# Patient Record
Sex: Female | Born: 1980 | Race: White | Hispanic: No | Marital: Married
Health system: Midwestern US, Academic
[De-identification: ages and names within clinical notes are randomized; demographics above are authoritative.]

---

## 2017-07-07 LAB — COMPREHENSIVE METABOLIC PANEL
Lab: 0.6
Lab: 0.8
Lab: 103
Lab: 104
Lab: 12 — ABNORMAL HIGH (ref 27.0–31.0)
Lab: 139
Lab: 14
Lab: 14
Lab: 15 — ABNORMAL HIGH (ref 0–14)
Lab: 4.6
Lab: 8
Lab: 82
Lab: 9.5

## 2017-07-07 LAB — CBC: Lab: 10

## 2017-07-07 LAB — THYROID STIMULATING HORMONE-TSH: Lab: 1.2 — ABNORMAL LOW (ref 3.5–5.1)

## 2017-07-07 LAB — MYOGLOBIN-CHEM: Lab: 17 — ABNORMAL LOW (ref 22–29)

## 2017-07-07 LAB — TROPONIN-I

## 2017-07-08 ENCOUNTER — Encounter: Admit: 2017-07-08 | Discharge: 2017-07-08 | Payer: BC Managed Care – PPO

## 2017-07-09 ENCOUNTER — Encounter: Admit: 2017-07-09 | Discharge: 2017-07-09 | Payer: BC Managed Care – PPO

## 2017-07-10 ENCOUNTER — Encounter: Admit: 2017-07-10 | Discharge: 2017-07-10 | Payer: BC Managed Care – PPO

## 2017-07-10 DIAGNOSIS — Z720 Tobacco use: Principal | ICD-10-CM

## 2017-07-10 DIAGNOSIS — R002 Palpitations: ICD-10-CM

## 2017-07-23 ENCOUNTER — Encounter: Admit: 2017-07-23 | Discharge: 2017-07-23 | Payer: BC Managed Care – PPO

## 2017-07-23 ENCOUNTER — Ambulatory Visit: Admit: 2017-07-23 | Discharge: 2017-07-24 | Payer: BC Managed Care – HMO

## 2017-07-23 DIAGNOSIS — R002 Palpitations: ICD-10-CM

## 2017-07-23 DIAGNOSIS — Z720 Tobacco use: Principal | ICD-10-CM

## 2017-07-23 DIAGNOSIS — K219 Gastro-esophageal reflux disease without esophagitis: ICD-10-CM

## 2017-07-30 ENCOUNTER — Encounter: Admit: 2017-07-30 | Discharge: 2017-07-30 | Payer: BC Managed Care – PPO

## 2017-08-10 ENCOUNTER — Ambulatory Visit: Admit: 2017-08-10 | Discharge: 2017-08-11 | Payer: BC Managed Care – HMO

## 2017-08-10 DIAGNOSIS — Z720 Tobacco use: Principal | ICD-10-CM

## 2017-08-10 DIAGNOSIS — R002 Palpitations: ICD-10-CM

## 2017-08-11 ENCOUNTER — Encounter: Admit: 2017-08-11 | Discharge: 2017-08-11 | Payer: BC Managed Care – PPO

## 2017-08-17 ENCOUNTER — Ambulatory Visit: Admit: 2017-08-17 | Discharge: 2017-08-18 | Payer: BC Managed Care – HMO

## 2017-08-18 DIAGNOSIS — Z720 Tobacco use: Principal | ICD-10-CM

## 2017-08-18 DIAGNOSIS — R002 Palpitations: ICD-10-CM

## 2017-08-27 ENCOUNTER — Encounter: Admit: 2017-08-27 | Discharge: 2017-08-27 | Payer: BC Managed Care – PPO

## 2017-08-27 ENCOUNTER — Ambulatory Visit: Admit: 2017-08-27 | Discharge: 2017-08-28 | Payer: BC Managed Care – HMO

## 2017-08-27 DIAGNOSIS — K219 Gastro-esophageal reflux disease without esophagitis: ICD-10-CM

## 2017-08-27 DIAGNOSIS — F419 Anxiety disorder, unspecified: ICD-10-CM

## 2017-08-27 DIAGNOSIS — R002 Palpitations: ICD-10-CM

## 2017-08-27 DIAGNOSIS — G4739 Other sleep apnea: Principal | ICD-10-CM

## 2017-08-27 DIAGNOSIS — Z720 Tobacco use: Principal | ICD-10-CM

## 2017-08-31 ENCOUNTER — Encounter: Admit: 2017-08-31 | Discharge: 2017-08-31 | Payer: BC Managed Care – PPO

## 2017-09-19 ENCOUNTER — Encounter: Admit: 2017-09-19 | Discharge: 2017-09-19 | Payer: BC Managed Care – PPO

## 2017-09-19 DIAGNOSIS — G4739 Other sleep apnea: ICD-10-CM

## 2017-09-19 DIAGNOSIS — Z720 Tobacco use: ICD-10-CM

## 2017-09-19 DIAGNOSIS — R002 Palpitations: ICD-10-CM

## 2017-09-19 DIAGNOSIS — F419 Anxiety disorder, unspecified: ICD-10-CM

## 2017-09-19 DIAGNOSIS — K219 Gastro-esophageal reflux disease without esophagitis: Principal | ICD-10-CM

## 2017-11-18 ENCOUNTER — Encounter: Admit: 2017-11-18 | Discharge: 2017-11-18 | Payer: BC Managed Care – PPO

## 2017-11-26 ENCOUNTER — Encounter: Admit: 2017-11-26 | Discharge: 2017-11-26 | Payer: BC Managed Care – PPO

## 2017-11-26 ENCOUNTER — Ambulatory Visit: Admit: 2017-11-26 | Discharge: 2017-11-27 | Payer: BC Managed Care – HMO

## 2017-11-26 DIAGNOSIS — G4739 Other sleep apnea: ICD-10-CM

## 2017-11-26 DIAGNOSIS — Z720 Tobacco use: Principal | ICD-10-CM

## 2017-11-26 DIAGNOSIS — K219 Gastro-esophageal reflux disease without esophagitis: ICD-10-CM

## 2017-11-26 DIAGNOSIS — R002 Palpitations: ICD-10-CM

## 2018-11-30 ENCOUNTER — Encounter: Admit: 2018-11-30 | Discharge: 2018-11-30

## 2018-11-30 ENCOUNTER — Ambulatory Visit: Admit: 2018-11-30 | Discharge: 2018-12-01

## 2018-11-30 DIAGNOSIS — Z720 Tobacco use: Secondary | ICD-10-CM

## 2018-11-30 DIAGNOSIS — R002 Palpitations: Secondary | ICD-10-CM

## 2018-11-30 DIAGNOSIS — F419 Anxiety disorder, unspecified: Secondary | ICD-10-CM

## 2018-11-30 DIAGNOSIS — G4739 Other sleep apnea: Secondary | ICD-10-CM

## 2018-11-30 DIAGNOSIS — K219 Gastro-esophageal reflux disease without esophagitis: Secondary | ICD-10-CM

## 2018-11-30 DIAGNOSIS — E7800 Pure hypercholesterolemia, unspecified: Secondary | ICD-10-CM

## 2018-11-30 MED ORDER — ESCITALOPRAM OXALATE 10 MG PO TAB
10 mg | ORAL_TABLET | Freq: Every day | ORAL | 3 refills | Status: DC
Start: 2018-11-30 — End: 2019-01-21

## 2018-11-30 NOTE — Patient Instructions
Check to see if your Ziopatch will be covered by insurance:       Please call the [employer] (ziopatch) company at [phone]-- option #1 (3 times)   - ask for the Direct Billing Patient Benefits department.     They should tell you if you are covered and how much the co-pay will be if there is one.  We generally don't put ziopatches on the same day of your appointment, unless you have medicare,  as they usually will pay or have a low co-pay.  However, if you have medicare and are concerned at all of your co-pay or insurance payment, then you should go ahead and call [employer].      If the co-pay is too high, you can always negotiate a self-pay rate with [employer]- (not going through insurance) - which may be a more reasonable cost for you.  Please note that once they submit the bill to the insurance for the ziopatch, the bill is non-negotiable, so all negotiations needs to occur when you first contact them.     Please let us know if the cost is not reasonable for you and we will order another type of monitoring.  If you are ok with the co-pay, please call us and schedule to come back to clinic to have the ziopatch applied.

## 2018-11-30 NOTE — Progress Notes
Date of Service: 11/30/2018    Tanya Pearson is a 38 y.o. female.       HPI     Patient is a very pleasant 38 year old white female that has a history of heart palpitations.  She reports that in the past few weeks her symptoms have increased, they are more frequent, these episodes of symptomatic heart palpitations are not associated with dizziness, sometimes she can have them 2 days neuro or even daily and then they will not occurred again for few days.  Typically they last 15 minutes, they are not associated with presyncope or syncope, shortness of breath can occur infrequently.    Patient also has a history of anxiety.    She was evaluated with an echocardiogram in March 2019 and the study was overall unremarkable.  An event monitor also performed in March 2019 did not demonstrate any significant arrhythmias.       Vitals:    11/30/18 0921 11/30/18 0938   BP: 104/70 106/70   BP Source: Arm, Left Upper Arm, Right Upper   Pulse: 86    SpO2: 98%    Weight: 83.6 kg (184 lb 3.2 oz)    Height: 1.626 m (5' 4)    PainSc: Zero      Body mass index is 31.62 kg/m???.     Past Medical History  Patient Active Problem List    Diagnosis Date Noted   ??? Other sleep apnea 08/27/2017   ??? Anxiety 08/27/2017   ??? GERD (gastroesophageal reflux disease) 07/23/2017   ??? Tobacco abuse 07/10/2017   ??? Palpitations 07/10/2017         Review of Systems   Constitution: Negative.   HENT: Negative.    Eyes: Negative.    Cardiovascular: Positive for palpitations.   Respiratory: Negative.    Endocrine: Negative.    Hematologic/Lymphatic: Negative.    Skin: Negative.    Musculoskeletal: Negative.    Gastrointestinal: Negative.    Genitourinary: Negative.    Neurological: Negative.    Psychiatric/Behavioral: The patient is nervous/anxious.    Allergic/Immunologic: Positive for environmental allergies.       Physical Exam  General Appearance: normal in appearance  Skin: warm, moist, no ulcers or xanthomas Eyes: conjunctivae and lids normal, pupils are equal and round  Lips & Oral Mucosa: no pallor or cyanosis  Neck Veins: neck veins are flat, neck veins are not distended  Chest Inspection: chest is normal in appearance  Respiratory Effort: breathing comfortably, no respiratory distress  Auscultation/Percussion: lungs clear to auscultation, no rales or rhonchi, no wheezing  Cardiac Rhythm: regular rhythm and normal rate  Cardiac Auscultation: S1, S2 normal, no rub, no gallop  Murmurs: no murmur  Carotid Arteries: normal carotid upstroke bilaterally, no bruit  Lower Extremity Edema: no lower extremity edema  Abdominal Exam: soft, non-tender, no masses, bowel sounds normal  Liver & Spleen: no organomegaly  Language and Memory: patient responsive and seems to comprehend information  Neurologic Exam: neurological assessment grossly intact      Cardiovascular Studies  Twelve-lead EKG demonstrates normal sinus rhythm, no ST segment T wave changes    Problems Addressed Today  Encounter Diagnoses   Name Primary?   ??? Pure hypercholesterolemia Yes   ??? Palpitations    ??? Anxiety    ??? Tobacco abuse    ??? Other sleep apnea    ??? Gastroesophageal reflux disease, esophagitis presence not specified        Assessment and Plan     In  summary: This is a 38 year old white female with a history of symptomatic heart palpitations, her episodes have intensified lately, they occur more frequently and last longer, patient has not experienced presyncope or syncope.    She was evaluated with an echocardiogram in March 2019 and was not found to have any structural heart disease.    Plan:    1.  I did suggest another 14 days Holter monitor for evaluation of underlying arrhythmias, patient could have episodes of SVT or she could have PVCs although these were not identified in the past.  2.  For symptoms of anxiety that she openly admits I did initiate escitalopram 10 mg p.o. daily. 3.  Follow-up in approximately 10 to 12 weeks to discuss the results of the event monitor and patient symptoms.  He will then I asked her to continue all other current medications and to monitor her symptoms.         Current Medications (including today's revisions)  ??? loratadine (CLARITIN) 10 mg tablet Take 10 mg by mouth as Needed.   ??? MAGNESIUM PO Take 325 mg by mouth daily.   ??? omeprazole(+) (PRILOSEC) 10 mg capsule Take 10 mg by mouth as Needed.   ??? other medication 1 Dose. olly stress gummies as needed

## 2018-11-30 NOTE — Progress Notes
[employer] Enrollment complete

## 2018-11-30 NOTE — Progress Notes
Long Term Monitor Placement Record  Ordering Physician: Dr. Samantha Crimes  Diagnosis: Palpitations  Length: 14 days  Serial Number: [id number]  Location: [facility]

## 2018-12-27 ENCOUNTER — Encounter: Admit: 2018-12-27 | Discharge: 2018-12-27

## 2018-12-27 NOTE — Telephone Encounter
12/27/2018 1:40 PM Patient called back.  We discussed holter results and recommendations, per below.  Patient stated she discussed concern of starting beta blocker with Dr. Virgina Organ at her last office visit, due to low blood pressure.  Patient stated that she "feels great" after starting on Lexapro; patient started with 5 mg daily, 7/2-12/09/2018; now up to prescribed dose of 10 mg daily, starting on 12/10/2018.  Patient no longer feels palpitations; denies dizziness.    I told the patient we would discuss with Dr. Virgina Organ and f/u with her any any further recommendations.  She will continue on Lexapro and keep her OV in October, for now.    Routing this note to [facility]. [facility] RN pool for follow-up with Dr. Virgina Organ.

## 2019-01-04 NOTE — Telephone Encounter
Reviewed with [facility]. [facility] okay with holding off starting a beta blocker and will f/u in Oct.  No further needs at this time.

## 2019-01-21 ENCOUNTER — Encounter: Admit: 2019-01-21 | Discharge: 2019-01-21

## 2019-01-21 MED ORDER — ESCITALOPRAM OXALATE 10 MG PO TAB
ORAL_TABLET | Freq: Every day | 0 refills | Status: DC
Start: 2019-01-21 — End: 2019-03-17

## 2019-01-21 NOTE — Telephone Encounter
01/21/2019 2:44 PM Refilled Lexapro, per Dr. Virgina Organ.

## 2019-03-17 ENCOUNTER — Encounter: Admit: 2019-03-17 | Discharge: 2019-03-17 | Payer: BC Managed Care – PPO

## 2019-03-17 DIAGNOSIS — R002 Palpitations: Secondary | ICD-10-CM

## 2019-03-17 DIAGNOSIS — Z720 Tobacco use: Secondary | ICD-10-CM

## 2019-03-17 DIAGNOSIS — R9431 Abnormal electrocardiogram [ECG] [EKG]: Secondary | ICD-10-CM

## 2019-03-17 DIAGNOSIS — K219 Gastro-esophageal reflux disease without esophagitis: Secondary | ICD-10-CM

## 2019-03-17 DIAGNOSIS — G4739 Other sleep apnea: Secondary | ICD-10-CM

## 2019-03-17 MED ORDER — ESCITALOPRAM OXALATE 10 MG PO TAB
10 mg | ORAL_TABLET | Freq: Every day | ORAL | 5 refills | Status: DC
Start: 2019-03-17 — End: 2019-05-30

## 2019-03-17 MED ORDER — DILTIAZEM HCL 30 MG PO TAB
30 mg | ORAL_TABLET | Freq: Two times a day (BID) | ORAL | 3 refills | 45.00000 days | Status: AC
Start: 2019-03-17 — End: ?

## 2019-03-17 NOTE — Patient Instructions
Thank you for visiting our office today.    We would like to make the following medication adjustments:      Start Cardizem 30 mg daily.        Otherwise continue the same medications as you have been doing.          We will be pursuing the following tests after your appointment today:       Orders Placed This Encounter    dilTIAZem HCL (CARDIZEM) 30 mg tablet    escitalopram oxalate (LEXAPRO) 10 mg tablet         We will send you a reminder for your next appointment. Please call us in the meantime with any questions or concerns.        Please allow 5-7 business days for our providers to review your results. All normal results will go to MyChart. If you do not have Mychart, it is strongly recommended to get this so you can easily view all your results. If you do not have mychart, we will attempt to call you once with normal lab and testing results. If we cannot reach you by phone with normal results, we will send you a letter.  If you have not heard the results of your testing after one week please give [place] a call.       Your Cardiovascular Medicine [place] Team Richardson Landry, Rene Kocher and [place])  phone number is [phone].

## 2019-03-17 NOTE — Progress Notes
Date of Service: 03/17/2019    Tanya Pearson is a 38 y.o. female.       HPI     Patient is a 38 year old white female, she was evaluated in our office in July for symptomatic heart palpitations.  At that time patient also exhibited symptoms of anxiety and I did initiate Lexapro 10 mg p.o. daily and she thinks that that helped a great deal.    In addition patient was evaluated with 14 days Holter monitor, this demonstrated predominantly normal sinus rhythm, one episode of SVT occurred and it lasted 9.9 seconds, the overall supraventricular tachycardia burden was 1.1%, patient also had isolated PVCs which represented less than 1% of the total number of beats.    She continues to have symptomatic heart palpitations, she notices this more when she tries to relax or she sits down.  Patient has decreased the consumption of caffeinated products and also currently smokes less than 1/2 pack cigarettes a day.         Vitals:    03/17/19 0909   BP: 110/76   BP Source: Arm, Left Upper   Pulse: 72   Temp: 37.9 ?C (100.2 ?F)   SpO2: 98%   Weight: 83.9 kg (185 lb)   Height: 1.626 m (5' 4)   PainSc: Zero     Body mass index is 31.76 kg/m?Marland Kitchen     Past Medical History  Patient Active Problem List    Diagnosis Date Noted   ? Other sleep apnea 08/27/2017   ? Anxiety 08/27/2017   ? GERD (gastroesophageal reflux disease) 07/23/2017   ? Tobacco abuse 07/10/2017   ? Palpitations 07/10/2017         Review of Systems   Constitution: Negative.   HENT: Negative.    Eyes: Negative.    Cardiovascular: Positive for palpitations.   Respiratory: Negative.    Endocrine: Negative.    Hematologic/Lymphatic: Negative.    Skin: Negative.    Musculoskeletal: Negative.    Gastrointestinal: Negative.    Genitourinary: Negative.    Neurological: Negative.    Psychiatric/Behavioral: Negative.    Allergic/Immunologic: Negative.        Physical Exam    General Appearance: normal in appearance  Skin: warm, moist, no ulcers or xanthomas Eyes: conjunctivae and lids normal, pupils are equal and round  Lips & Oral Mucosa: no pallor or cyanosis  Neck Veins: neck veins are flat, neck veins are not distended  Chest Inspection: chest is normal in appearance  Respiratory Effort: breathing comfortably, no respiratory distress  Auscultation/Percussion: lungs clear to auscultation, no rales or rhonchi, no wheezing  Cardiac Rhythm: regular rhythm and normal rate  Cardiac Auscultation: S1, S2 normal, no rub, no gallop  Murmurs: no murmur  Carotid Arteries: normal carotid upstroke bilaterally, no bruit  Lower Extremity Edema: no lower extremity edema  Abdominal Exam: soft, non-tender, no masses, bowel sounds normal  Liver & Spleen: no organomegaly  Language and Memory: patient responsive and seems to comprehend information  Neurologic Exam: neurological assessment grossly intact    Cardiovascular Studies      Problems Addressed Today  Encounter Diagnoses   Name Primary?   ? Tobacco abuse Yes   ? Palpitations    ? Gastroesophageal reflux disease, unspecified whether esophagitis present    ? Other sleep apnea        Assessment and Plan     In summary: This is a 38 year old white female with symptomatic heart palpitations, these are represented by supraventricular ectopy,  this was identified to represent 1.1% on the recent 14 days Holter monitor, there were no other significant arrhythmias.    Patient continues to smoke tobacco.  The echocardiogram did not demonstrate any structural heart disease.    Plan:    1.  I suggested to initiate diltiazem 30 mg p.o. twice daily for symptoms of heart palpitations  2.  I did renew the prescription for Lexapro 10 mg p.o. daily  ? Patient states that this helped a great deal with her symptoms of anxiety  3.  I recommended overall risk factors modification  4.  Follow-up office visit in 6 months.         Current Medications (including today's revisions)  ? escitalopram oxalate (LEXAPRO) 10 mg tablet TAKE ONE TABLET BY MOUTH DAILY FOR ANXIOUSNESS ASSOCIATED WITH DEPRESSION   ? loratadine (CLARITIN) 10 mg tablet Take 10 mg by mouth as Needed.   ? MAGNESIUM PO Take 250 mg by mouth daily.   ? omeprazole(+) (PRILOSEC) 10 mg capsule Take 10 mg by mouth as Needed.   ? other medication 1 Dose. olly stress gummies as needed

## 2019-05-30 ENCOUNTER — Encounter: Admit: 2019-05-30 | Discharge: 2019-05-30 | Payer: BC Managed Care – PPO

## 2019-05-30 MED ORDER — ESCITALOPRAM OXALATE 10 MG PO TAB
10 mg | ORAL_TABLET | Freq: Every day | ORAL | 5 refills | Status: DC
Start: 2019-05-30 — End: 2019-08-31

## 2019-08-30 ENCOUNTER — Encounter: Admit: 2019-08-30 | Discharge: 2019-08-30 | Payer: BC Managed Care – PPO

## 2019-08-30 MED ORDER — ESCITALOPRAM OXALATE 10 MG PO TAB
ORAL_TABLET | Freq: Every day | ORAL | 1 refills | 30.00000 days | Status: AC
Start: 2019-08-30 — End: ?

## 2019-11-10 ENCOUNTER — Encounter: Admit: 2019-11-10 | Discharge: 2019-11-10 | Payer: BC Managed Care – PPO

## 2019-11-10 DIAGNOSIS — R9431 Abnormal electrocardiogram [ECG] [EKG]: Secondary | ICD-10-CM

## 2019-11-10 DIAGNOSIS — Z720 Tobacco use: Secondary | ICD-10-CM

## 2019-11-10 DIAGNOSIS — G4739 Other sleep apnea: Secondary | ICD-10-CM

## 2019-11-10 DIAGNOSIS — F419 Anxiety disorder, unspecified: Secondary | ICD-10-CM

## 2019-11-10 DIAGNOSIS — K219 Gastro-esophageal reflux disease without esophagitis: Secondary | ICD-10-CM

## 2019-11-10 DIAGNOSIS — R002 Palpitations: Secondary | ICD-10-CM

## 2020-09-10 ENCOUNTER — Encounter: Admit: 2020-09-10 | Discharge: 2020-09-10 | Payer: BC Managed Care – PPO

## 2020-09-10 MED ORDER — ESCITALOPRAM OXALATE 10 MG PO TAB
ORAL_TABLET | Freq: Every day | 3 refills | Status: AC
Start: 2020-09-10 — End: ?

## 2020-09-25 ENCOUNTER — Encounter: Admit: 2020-09-25 | Discharge: 2020-09-25 | Payer: BC Managed Care – PPO

## 2020-09-25 DIAGNOSIS — U071 COVID-19: Secondary | ICD-10-CM

## 2020-09-25 DIAGNOSIS — G4739 Other sleep apnea: Secondary | ICD-10-CM

## 2020-09-25 DIAGNOSIS — R002 Palpitations: Secondary | ICD-10-CM

## 2020-09-25 DIAGNOSIS — K219 Gastro-esophageal reflux disease without esophagitis: Secondary | ICD-10-CM

## 2020-09-25 DIAGNOSIS — R9431 Abnormal electrocardiogram [ECG] [EKG]: Secondary | ICD-10-CM

## 2020-09-25 DIAGNOSIS — I493 Ventricular premature depolarization: Secondary | ICD-10-CM

## 2020-09-25 DIAGNOSIS — Z720 Tobacco use: Secondary | ICD-10-CM

## 2020-09-25 DIAGNOSIS — I491 Atrial premature depolarization: Secondary | ICD-10-CM

## 2020-09-25 MED ORDER — BUPROPION SR 150 MG PO TBSR
150 mg | ORAL_TABLET | Freq: Two times a day (BID) | ORAL | 1 refills | Status: AC
Start: 2020-09-25 — End: ?

## 2020-09-25 NOTE — Progress Notes
Date of Service: 09/25/2020    Tanya Pearson is a 40 y.o. female.       HPI     Patient is a very pleasant 40 year old white female, she was last seen in the office in June 2021.  She does have a history of present tobacco use of approximately half pack of cigarettes a day, occasional and short-lived symptomatic heart palpitations, anxiety, history of COVID-19 up for respiratory tract infection in January 2022, and recent history of kidney stones.    Patient reports that at times she does experience very brief episodes of heart palpitations, these usually occur at night and she feels like there are 1?2 beats abnormal, and then everything quiets down.  Patient has been prescribed diltiazem to take as needed for symptomatic heart palpitations.  She has not taken any of that medication.    Patient in the past was evaluated with a Holter monitor, this was performed in June 2020, she was found to have isolated supraventricular ectopy and also ventricular ectopy, this represented less than 1% of the total number of beats, there were no significant arrhythmias.  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         Vitals:    09/25/20 1052   BP: 106/80   BP Source: Arm, Left Upper   Pulse: 73   SpO2: 97%   O2 Device: None (Room air)   PainSc: Zero   Weight: 91.7 kg (202 lb 3.2 oz)   Height: 162.6 cm (5' 4)     Body mass index is 34.71 kg/m?Marland Kitchen     Past Medical History  Patient Active Problem List    Diagnosis Date Noted   ? PAC (premature atrial contraction) 09/22/2020   ? PVC (premature ventricular contraction) 09/22/2020   ? Abnormal Holter exam 03/17/2019   ? Other sleep apnea 08/27/2017   ? Anxiety 08/27/2017   ? GERD (gastroesophageal reflux disease) 07/23/2017   ? Tobacco abuse 07/10/2017   ? Palpitations 07/10/2017         Review of Systems   Constitutional: Negative.   HENT: Negative.    Eyes: Negative.    Cardiovascular: Positive for irregular heartbeat and palpitations.   Respiratory: Negative.    Endocrine: Negative.    Hematologic/Lymphatic: Negative.    Skin: Negative.    Musculoskeletal: Negative.    Gastrointestinal: Negative.    Genitourinary: Negative.    Neurological: Negative.    Psychiatric/Behavioral: Negative.    Allergic/Immunologic: Negative.        Physical Exam  General Appearance: normal in appearance  Skin: warm, moist, no ulcers or xanthomas  Eyes: conjunctivae and lids normal, pupils are equal and round  Lips & Oral Mucosa: no pallor or cyanosis  Neck Veins: neck veins are flat, neck veins are not distended  Chest Inspection: chest is normal in appearance  Respiratory Effort: breathing comfortably, no respiratory distress  Auscultation/Percussion: lungs clear to auscultation, no rales or rhonchi, no wheezing  Cardiac Rhythm: regular rhythm and normal rate  Cardiac Auscultation: S1, S2 normal, no rub, no gallop  Murmurs: no murmur  Carotid Arteries: normal carotid upstroke bilaterally, no bruit  Lower Extremity Edema: no lower extremity edema  Abdominal Exam: soft, non-tender, no masses, bowel sounds normal  Liver & Spleen: no organomegaly  Language and Memory: patient responsive and seems to comprehend information  Neurologic Exam: neurological assessment grossly intact    Cardiovascular Studies  Twelve-lead EKG demonstrates normal sinus rhythm, no  ST segment T wave changes, normal QT/QTc 400/407 ms, no axis deviation      Cardiovascular Health Factors  Vitals BP Readings from Last 3 Encounters:   09/25/20 106/80   11/10/19 114/78   03/17/19 110/76     Wt Readings from Last 3 Encounters:   09/25/20 91.7 kg (202 lb 3.2 oz)   11/10/19 90 kg (198 lb 6.4 oz)   03/17/19 83.9 kg (185 lb)     BMI Readings from Last 3 Encounters:   09/25/20 34.71 kg/m?   11/10/19 34.06 kg/m?   03/17/19 31.76 kg/m?      Smoking Social History     Tobacco Use   Smoking Status Current Every Day Smoker   ? Packs/day: 0.50   ? Types: Cigarettes   Smokeless Tobacco Never Used Lipid Profile Cholesterol   Date Value Ref Range Status   09/16/2016 144  Final     HDL   Date Value Ref Range Status   09/16/2016 52  Final     LDL   Date Value Ref Range Status   09/16/2016 87  Final     Triglycerides   Date Value Ref Range Status   09/16/2016 41  Final      Blood Sugar No results found for: HGBA1C  Glucose   Date Value Ref Range Status   03/16/2018 104  Final   07/07/2017 103  Final   09/16/2016 96  Final          Problems Addressed Today  Encounter Diagnoses   Name Primary?   ? Tobacco abuse Yes   ? Palpitations    ? Gastroesophageal reflux disease, unspecified whether esophagitis present    ? Other sleep apnea    ? Abnormal Holter exam    ? PAC (premature atrial contraction)    ? PVC (premature ventricular contraction)        Assessment and Plan     In summary: This is a 40 year old white female that presents with the following cardiovascular/medical issues:    1.  Symptomatic heart palpitations  2.  Overall low burden of supraventricular ectopy and SVT on a previous Holter monitor performed in June 2020?patient has been prescribed as needed diltiazem, she has not taken any  3.  History of COVID-19 upper respiratory tract infection?patient did suffer this injury 2022, she was treated symptomatically and she is currently doing well  4.  Continued use of tobacco products?patient currently smokes less than a pack of cigarettes a day  5.  Obesity?patient's BMI 34.71 kg/m??she is in the process of losing weight and exercising  6.  History of kidney stones?this episode occurred earlier this year    Plan:    1.  Continue current medications  2.  Prescribe Wellbutrin?smoking cessation regimen  3.  I encouraged the patient to continue weight reduction and overall risk factors modification  4.  Further follow-up with an echocardiogram in the setting of symptomatic heart palpitations and fairly recent infection of COVID 19, we will follow-up on the results and call the patient with further recommendations  5.  Follow-up office visit in approximately 1 year      Total Time Today was 30 minutes in the following activities: Preparing to see the patient, Obtaining and/or reviewing separately obtained history, Performing a medically appropriate examination and/or evaluation, Counseling and educating the patient/family/caregiver, Ordering medications, tests, or procedures, Referring and communication with other health care professionals (when not separately reported), Documenting clinical information in the electronic or other health  record, Independently interpreting results (not separately reported) and communicating results to the patient/family/caregiver and Care coordination (not separately reported)         Current Medications (including today's revisions)  ? cetirizine HCl (ZYRTEC PO) Take  by mouth daily.   ? dilTIAZem HCL (CARDIZEM) 30 mg tablet Take one tablet by mouth twice daily. Indications: paroxysmal supraventricular tachycardia (Patient taking differently: Take 30 mg by mouth as Needed. Indications: paroxysmal supraventricular tachycardia)   ? escitalopram oxalate (LEXAPRO) 10 mg tablet TAKE 1 TABLET BY MOUTH EVERY DAY   ? loratadine (CLARITIN) 10 mg tablet Take 10 mg by mouth as Needed.   ? MAGNESIUM PO Take 250 mg by mouth daily.   ? omeprazole(+) (PRILOSEC) 10 mg capsule Take 10 mg by mouth as Needed.

## 2020-09-25 NOTE — Patient Instructions
Echo   Follow up in 1 year    Wellbutrin as directed  150 mg PO qDay for 3 days, THEN Increase to 150 mg q12hr; should continue treatment for 7-12 weeks; if patient successfully quits after 7-12 weeks, consider ongoing maintenance therapy based on individual patient risk/benefit    Dosing considerations (Smoking Cessation)  Begin therapy 1 week before target quit date (usually second week of treatment)  May be used in combination with nicotine patch        Tips for Quitting Smoking (Cardiovascular)  Quitting smoking is a gift to yourself. It's one of the best things you can do to keep your heart disease from getting worse. Smoking reduces oxygen flow to your heart. It does this in 2 ways. It speeds the buildup of plaque along the artery walls. And it changes the health of your blood vessels. This raises your risk for heart attack, also known as acute myocardial infarction (AMI). Quitting helps reduce smoking's harmful effects.?You may have tried to quit before, but don?t give up. Try again. Many smokers try a few times before they succeed. It's never too early to benefit from quitting smoking. This is especially true if you already have ongoing (chronic) conditions such as high blood pressure and high cholesterol. These put you at higher risk for cardiovascular disease. Quitting smoking is a powerful way to reduce your risk for coronary disease.  Line up help  ? Ask for the support of your family and friends.  ? Join a smoking cessation class. Or ask your healthcare provider for a referral to a psychologist who specializes in helping people quit smoking.?  ? Ask your healthcare provider about nicotine replacement products. Also ask about prescription medicines that can help you quit. It may take some time to find a product and schedule that works for you.     Set a quit date  ? Choose a date in the next 2 to 4 weeks.  ? After picking a day, mark it in bold letters on a calendar.    Your quit list  Ideas to stop smoking include:  1. Start by giving up cigarettes at the times you least need them.  2. Keep some fruit close by at the times you are most likely to reach for a cigarette. For many people, smoking has an oral fixation component. This must be recognized and replaced by a healthy habit.  3. Use a nicotine replacement product instead of a cigarette.  Write down a few more ideas:  ______________________________________________________________________________  ______________________________________________________________________________  ______________________________________________________________________________  Set limits  ? Limit where you can smoke. Pick one room or a porch. Smoke only in that place.  ? Make smoking outdoors a house rule. Other smokers won?t tempt you as much.  ? Talk with smokers around you about your intent to stop smoking. Then they can show consideration for you and limit their smoking around you.  ? Hang a list of ?quit benefits? in the spot where you smoke. Put one on the refrigerator and one on your car dashboard.    For more information  ? [employer] Smoking http://levine.com/ 203-783-1808)    [employer] last reviewed this educational content on 04/02/2018  ? 2000-2021 [employer], [place]. All rights reserved. This information is not intended as a substitute for professional medical care. Always follow your healthcare professional's instructions.

## 2020-10-25 ENCOUNTER — Encounter: Admit: 2020-10-25 | Discharge: 2020-10-25 | Payer: BC Managed Care – PPO

## 2021-03-26 ENCOUNTER — Encounter: Admit: 2021-03-26 | Discharge: 2021-03-26 | Payer: BC Managed Care – PPO

## 2021-03-27 ENCOUNTER — Encounter: Admit: 2021-03-27 | Discharge: 2021-03-27 | Payer: BC Managed Care – PPO

## 2021-03-27 MED ORDER — BUPROPION SR 150 MG PO TBSR
Freq: Every day | 1 refills | Status: AC
Start: 2021-03-27 — End: ?

## 2021-08-30 ENCOUNTER — Encounter: Admit: 2021-08-30 | Discharge: 2021-08-30 | Payer: BC Managed Care – PPO

## 2021-09-17 ENCOUNTER — Ambulatory Visit: Admit: 2021-09-17 | Discharge: 2021-09-18

## 2021-09-17 ENCOUNTER — Encounter: Admit: 2021-09-17 | Discharge: 2021-09-17

## 2021-09-17 DIAGNOSIS — R0989 Other specified symptoms and signs involving the circulatory and respiratory systems: Secondary | ICD-10-CM

## 2021-09-17 DIAGNOSIS — R002 Palpitations: Secondary | ICD-10-CM

## 2021-09-17 DIAGNOSIS — R9431 Abnormal electrocardiogram [ECG] [EKG]: Secondary | ICD-10-CM

## 2021-09-17 DIAGNOSIS — I491 Atrial premature depolarization: Secondary | ICD-10-CM

## 2021-09-17 DIAGNOSIS — Z720 Tobacco use: Secondary | ICD-10-CM

## 2021-09-17 DIAGNOSIS — K219 Gastro-esophageal reflux disease without esophagitis: Secondary | ICD-10-CM

## 2021-09-17 DIAGNOSIS — I493 Ventricular premature depolarization: Secondary | ICD-10-CM

## 2021-09-17 DIAGNOSIS — G4739 Other sleep apnea: Secondary | ICD-10-CM

## 2021-09-17 MED ORDER — ESCITALOPRAM OXALATE 10 MG PO TAB
10 mg | ORAL_TABLET | Freq: Every day | ORAL | 3 refills | Status: AC
Start: 2021-09-17 — End: ?

## 2021-09-17 NOTE — Progress Notes
Date of Service: 09/17/2021    Tanya Pearson is a 41 y.o. female.       HPI     Tanya Pearson is a 41 y.o. white female with a history of heart palpitations, history of tobacco use, history of COVID-19 upper respiratory tract infection, history of kidney stones.    Patient was prescribed diltiazem 2 take in case she has prolonged episodes of symptomatic heart palpitations.    She was last seen in the office in April 2022.    Patient has not had any significant episodes of heart palpitations, this occurred rarely, in the evening when she tries to rest, and usually last a few seconds.    At the previous office visit I did prescribe Wellbutrin to aid with smoking cessation, and patient has been able to quit use of tobacco products.         Vitals:    09/17/21 1057   BP: 122/74   BP Source: Arm, Left Upper   Pulse: 76   SpO2: 97%   O2 Device: None (Room air)   PainSc: Zero   Weight: 92.5 kg (204 lb)   Height: 162.6 cm (5' 4)     Body mass index is 35.02 kg/m?Marland Kitchen     Past Medical History  Patient Active Problem List    Diagnosis Date Noted   ? COVID-19 09/25/2020   ? PAC (premature atrial contraction) 09/22/2020   ? PVC (premature ventricular contraction) 09/22/2020   ? Abnormal Holter exam 03/17/2019   ? Other sleep apnea 08/27/2017   ? Anxiety 08/27/2017   ? GERD (gastroesophageal reflux disease) 07/23/2017   ? Tobacco abuse 07/10/2017   ? Palpitations 07/10/2017         Review of Systems   Constitutional: Negative.   HENT: Negative.    Eyes: Negative.    Cardiovascular: Negative.    Respiratory: Negative.    Endocrine: Negative.    Hematologic/Lymphatic: Negative.    Skin: Negative.    Musculoskeletal: Negative.    Gastrointestinal: Negative.    Genitourinary: Negative.    Neurological: Negative.    Psychiatric/Behavioral: Negative.    Allergic/Immunologic: Negative.        Physical Exam  General Appearance: normal in appearance  Skin: warm, moist, no ulcers or xanthomas  Eyes: conjunctivae and lids normal, pupils are equal and round  Lips & Oral Mucosa: no pallor or cyanosis  Neck Veins: neck veins are flat, neck veins are not distended  Chest Inspection: chest is normal in appearance  Respiratory Effort: breathing comfortably, no respiratory distress  Auscultation/Percussion: lungs clear to auscultation, no rales or rhonchi, no wheezing  Cardiac Rhythm: regular rhythm and normal rate  Cardiac Auscultation: S1, S2 normal, no rub, no gallop  Murmurs: no murmur  Carotid Arteries: normal carotid upstroke bilaterally, no bruit  Lower Extremity Edema: no lower extremity edema  Abdominal Exam: soft, non-tender, no masses, bowel sounds normal  Liver & Spleen: no organomegaly  Language and Memory: patient responsive and seems to comprehend information  Neurologic Exam: neurological assessment grossly intact      Cardiovascular Studies  Twelve-lead EKG demonstrates normal sinus rhythm, incomplete RBBB, ventricular rate 60 bpm, no ST segment T wave changes    Cardiovascular Health Factors  Vitals BP Readings from Last 3 Encounters:   09/17/21 122/74   09/25/20 106/80   11/10/19 114/78     Wt Readings from Last 3 Encounters:   09/17/21 92.5 kg (204 lb)   09/25/20 91.7 kg (  202 lb 3.2 oz)   11/10/19 90 kg (198 lb 6.4 oz)     BMI Readings from Last 3 Encounters:   09/17/21 35.02 kg/m?   09/25/20 34.71 kg/m?   11/10/19 34.06 kg/m?      Smoking Social History     Tobacco Use   Smoking Status Every Day   ? Packs/day: 0.50   ? Types: Cigarettes   Smokeless Tobacco Never      Lipid Profile Cholesterol   Date Value Ref Range Status   09/16/2016 144  Final     HDL   Date Value Ref Range Status   09/16/2016 52  Final     LDL   Date Value Ref Range Status   09/16/2016 87  Final     Triglycerides   Date Value Ref Range Status   09/16/2016 41  Final      Blood Sugar No results found for: HGBA1C  Glucose   Date Value Ref Range Status   08/09/2021 111 (H) 70 - 105 Final   08/08/2021 127 (H) 70 - 105 Final   03/16/2018 104  Final Problems Addressed Today  Encounter Diagnoses   Name Primary?   ? PAC (premature atrial contraction) Yes   ? PVC (premature ventricular contraction)    ? Abnormal Holter exam    ? Other sleep apnea    ? Palpitations    ? Tobacco abuse    ? Gastroesophageal reflux disease, unspecified whether esophagitis present    ? Cardiovascular symptoms        Assessment and Plan     Assessment:    1.  History of symptomatic heart palpitations-no occurrence of any significant/prolonged episodes  2.  Overall low burden ectopy and SVT on a previous Holter monitor performed in June 2022-patient has been prescribed diltiazem, she has not had to take any  3.  History of tobacco use- with Wellbutrin patient has been able to quit smoking  4.  History of COVID-19 upper respiratory infection in January 2022-recovered  5.  Obesity-BMI 35.02 kg/m?  6.  History of kidney stones  7.  Incomplete IRBBB     Plan:    1.  Further evaluation with a 2D echo Doppler study-we will follow-up on the results and will call with further recommendations  2.  Continue weight loss and overall risk factors modifications  3.  I did refill a prescription for Lexapro  4.  Follow-up office visit in 1 year      Total Time Today was 30 minutes in the following activities: Preparing to see the patient, Obtaining and/or reviewing separately obtained history, Performing a medically appropriate examination and/or evaluation, Counseling and educating the patient/family/caregiver, Ordering medications, tests, or procedures, Referring and communication with other health care professionals (when not separately reported), Documenting clinical information in the electronic or other health record, Independently interpreting results (not separately reported) and communicating results to the patient/family/caregiver and Care coordination (not separately reported)         Current Medications (including today's revisions)  ? buPROPion HCL SR (WELLBUTRIN SR) 150 mg tablet, 12 hr sustained-release TAKE 1 TABLET BY MOUTH DAILY X 3 DAYS THEN 1 TAB BY MOUTH TWICE DAILY.   ? cetirizine HCl (ZYRTEC PO) Take  by mouth daily.   ? dilTIAZem HCL (CARDIZEM) 30 mg tablet Take one tablet by mouth twice daily. Indications: paroxysmal supraventricular tachycardia (Patient taking differently: Take one tablet by mouth as Needed. Indications: paroxysmal supraventricular tachycardia)   ? escitalopram oxalate (  LEXAPRO) 10 mg tablet TAKE 1 TABLET BY MOUTH EVERY DAY   ? loratadine (CLARITIN) 10 mg tablet Take one tablet by mouth as Needed.   ? MAGNESIUM PO Take 250 mg by mouth daily.   ? omeprazole(+) (PRILOSEC) 10 mg capsule Take one capsule by mouth as Needed.

## 2021-10-18 ENCOUNTER — Encounter: Admit: 2021-10-18 | Discharge: 2021-10-18 | Payer: BC Managed Care – PPO

## 2021-10-18 MED ORDER — ESCITALOPRAM OXALATE 10 MG PO TAB
ORAL_TABLET | 3 refills | Status: AC
Start: 2021-10-18 — End: ?

## 2022-06-19 ENCOUNTER — Encounter: Admit: 2022-06-19 | Discharge: 2022-06-19 | Payer: BC Managed Care – PPO

## 2022-06-19 MED ORDER — DILTIAZEM HCL 30 MG PO TAB
30 mg | ORAL_TABLET | Freq: Two times a day (BID) | ORAL | 3 refills | 90.00000 days | Status: AC
Start: 2022-06-19 — End: ?

## 2022-07-27 ENCOUNTER — Encounter: Admit: 2022-07-27 | Discharge: 2022-07-27 | Payer: BC Managed Care – PPO

## 2022-07-27 MED ORDER — DILTIAZEM HCL 30 MG PO TAB
30 mg | ORAL_TABLET | Freq: Two times a day (BID) | ORAL | 1 refills
Start: 2022-07-27 — End: ?

## 2022-09-16 ENCOUNTER — Encounter: Admit: 2022-09-16 | Discharge: 2022-09-16 | Payer: BC Managed Care – PPO

## 2022-09-16 NOTE — Telephone Encounter
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 This individual is negative for a duplicated CYP2D6 gene (genotype CYP2D6*4/CYP2D6*4). 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 Individuals with the Poor Metabolizer phenotype will likely experience severely decreased therapeutic efficacy for drugs that require CYP2D6 activity for the generation of the active metabolite(s). In addition, these individuals are at increased risk of experiencing adverse side effects or drug toxicity if they are administered therapeutic agents that are inactivated by CYP2D6. Genetic counseling is recommended. Laboratory results and submitted clinical information reviewed by Raynelle Bring, Ph.D., [facility], CGMBS. Cytochrome P450 2D6 (CYP2D6) is an enzyme that is responsible for the metabolism of a wide variety of compounds (e.g., >25% of available medications) including antidepressants, antipsychotics, codeine, tamoxifen and many others. Both genetic and environmental factors can influence the level of CYP2D6 activity. Variations in the CYP2D6 gene (i.e., allelic differences) can contribute to inter-individual differences in enzyme levels and/or activity, and therefore, inter-individual variations in the efficacy and/or toxicity of various compounds. In some cases, medication response may be impacted by variants in other genes and additional genetic testing could provide additional insights. The normal CYP2D6 allele is designated as CYP2D6*1. This assay detects 7 no function alleles: CYP2D6*3 (c.775delA, [id number]), CYP2D6*4 (c.506-1G>A, [id number]), CYP2D6*5 (gene deletion), CYP2D6*6 (c.454delT, [id number]), CYP2D6*7 (c.971A>C, [id number]), CYP2D6*8 (c.505G>T, [id number]), and ZWC5E5*27P (OEU2P5*361) (c.505G>A, [id number] and c.100C>T, [id number]). It also detects 5 decreased function alleles: CYP2D6*9 (c.841_843delAAG, [id number]), CYP2D6*10 (c.100C>T, [id number]), CYP2D6*14B (CYP2D6*14) (c.505G>A, [id number]), CYP2D6*17 (c.320C>T, [id number]), and CYP2D6*41 (c.985+39G>A, [id number]). Duplications of the CYP2D6 gene are detected, but this assay cannot determine the identity of the duplicated allele(s) nor can it determine the precise number of copies of the CYP2D6 gene. An allele that is negative for the variants tested for by this assay is inferred to be the CYP2D6*1 (normal) allele. The predicted CYP2D6 phenotype is based on the Clinical Pharmacogenomics Implementation Consortium [facility]) activity scoring system (0 = Poor Metabolizer, 0.25-1 = Intermediate Metabolizer, 1.25 to 2.25 = Normal Metabolizer, >2.25 = Ultrarapid Metabolizer). Approximately 3% of African Americans, 6% of Caucasians, and 1% of East Asians are predicted to be Poor Metabolizers. Variants in the CYP2D6 gene are detected by single nucleotide primer extension after polymerase chain reaction (PCR) amplification of the CYP2D6 gene. Fluorescent extension products are analyzed by capillary electrophoresis. This test cannot rule out the presence of variants in other regions of the CYP2D6 gene. Although rare, false positive or false negative results may  occur. All results should be interpreted in the context of  clinical findings, relevant history, and other laboratory  data.  Additional information can be found by consulting [employer]  (PrepaidPayroll.ca), CPIC (cpicpgx.org), and  https://www.white.net/  ic-biomark ers-drug-labeling. Health care providers may also  contact your local [facility]' genetic counselor or  call 1-866-GENEINFO [phone]) for assistance with the  interpretation of these results.  This test is performed pursuant to license agreements with  [employer].  This test was developed and its analytical performance  characteristics have been determined by [facility]. It has not been  cleared or approved by the FDA. This assay has been  validated pursuant to the CLIA regulations and is used for  clinical purposes.  A portion of the testing was performed at [facility].  Reviewed and signed by Laboratory results and submitted  clinical information reviewed by Raynelle Bring, Ph.D., [facility],  CGMBS,  Signed on 04/15/2022 at 07:14

## 2022-09-25 ENCOUNTER — Encounter: Admit: 2022-09-25 | Discharge: 2022-09-25 | Payer: Commercial Managed Care - PPO

## 2022-09-25 ENCOUNTER — Ambulatory Visit: Admit: 2022-09-25 | Discharge: 2022-09-25 | Payer: Commercial Managed Care - PPO

## 2022-09-25 ENCOUNTER — Ambulatory Visit: Admit: 2022-09-25 | Discharge: 2022-09-26 | Payer: Commercial Managed Care - PPO

## 2022-09-25 DIAGNOSIS — I493 Ventricular premature depolarization: Secondary | ICD-10-CM

## 2022-09-25 DIAGNOSIS — Z720 Tobacco use: Secondary | ICD-10-CM

## 2022-09-25 DIAGNOSIS — G4739 Other sleep apnea: Secondary | ICD-10-CM

## 2022-09-25 DIAGNOSIS — R0989 Other specified symptoms and signs involving the circulatory and respiratory systems: Secondary | ICD-10-CM

## 2022-09-25 DIAGNOSIS — R002 Palpitations: Secondary | ICD-10-CM

## 2022-09-25 DIAGNOSIS — I491 Atrial premature depolarization: Secondary | ICD-10-CM

## 2022-09-25 DIAGNOSIS — U071 COVID-19: Secondary | ICD-10-CM

## 2022-09-25 DIAGNOSIS — R9431 Abnormal electrocardiogram [ECG] [EKG]: Secondary | ICD-10-CM

## 2022-09-25 DIAGNOSIS — K219 Gastro-esophageal reflux disease without esophagitis: Secondary | ICD-10-CM

## 2022-09-25 DIAGNOSIS — R42 Dizziness and giddiness: Secondary | ICD-10-CM

## 2022-09-25 NOTE — Progress Notes
To our valued patient,     We have enrolled your heart monitor and requested it to be mailed to your home.  You should receive this within 4 - 6 business days. Please wear the monitor for 14 days. When you have completed the study, please remove the device, and mail it back to the company. Please call Customer Service at [phone] (option 1, 1, 1) with questions about placement, troubleshooting, and insurance coverage. You can reach the ambulatory heart monitor team at [phone].        Please write your NAME, PHYSICIAN, START DATE/TIME on the diary.    Prep skin by shaving and ensuring there is no lotion on the chest.  Gently abrade for clear ecgs.  Showering is okay, but best to shower with back to the water.    Please use the diary for symptoms - specific date and times and what you are feeling.  Please return the device in the mailer with diary promptly after completing the study.        Your Heart Rhythm Management Team  Cardiovascular Medicine Department at [facility]          Ambulatory (External) Cardiac Monitor Enrollment Record     Placement Location: Home Enrollment  Vendor: [facility] Cardiac Telemetry (MCOT/MCT)?: No  Duration of Monitor (in days): 14  Monitor Diagnosis: PAC (premature atrial contraction) (I49.1)  Secondary Monitor Diagnosis: PVC (premature ventricular contraction) (I49.3)  Ordering Provider: Dorris Fetch, MD  AMB Monitor Serial Number: [id number]  No data recorded    Start Time and Date: 09/25/22 10:57 AM   Patient Name: Tanya Pearson  DOB: [birth year]  MRN: [id number]  Sex: female  Mobile Phone Number: [phone] (mobile)  Home Phone Number: [phone]  Patient Address: [address] [place] [address]  Insurance Coverage: [employer] - [id number]  Insurance ID: [id number]  Insurance Group #: [id number]  Insurance Subscriber: [name]  Implanted Cardiac Device Information: No results found for: EPDEVTYP      Patient instructed to contact company phone number on the monitor box with questions regarding billing, placement, troubleshooting.     Tanya Pearson    ____________________________________________________________    Clinic Staff:    Complete additional steps for documentation double check/Co-Sign.  In Follow-up, send chart upon closing encounter to P [facility] AMBULATORY MONITORS    [facility] Ambulatory Monitoring Team:  Schedule on appropriate template and check-in.   Clinic Placement Schedule on clinic location [facility] schedule   Home Enrollment Schedule on Home Enrollment schedule ([facility] RHYTHM)   Given to patient in clinic for self-placement Schedule on Home Enrollment schedule ([facility]RT RHYTHM)   Inpatient Schedule on [place] [facility] AMBULATORY MONITORING template   2. Please enroll with appropriate vendor.

## 2022-09-25 NOTE — Progress Notes
Date of Service: 09/25/2022    Tanya Pearson is a 42 y.o. female.       HPI       Tanya Pearson is a 42 y.o.  white female with a history of heart palpitations, tobacco use, history of COVID-19 viral infection, kidney stones, obesity, current BMI 36.84 kg/m? and significant weight gain of approximately 37 pounds since June 2020.    Due to the history of heart palpitations patient was prescribed Cardizem 30 mg p.o. twice daily to be taken as needed for heart palpitations.  Few months ago patient started to experience symptomatic heart palpitations.  She started to take Cardizem, however she took only 30 mg p.o. daily.  She has not had any chest pain and no heart palpitations.    She does not have any documented history of coronary artery disease.    Due to dietary indiscretion, she did gain 37 pounds since June 2020, at the time patient weighed 184 pounds, today she weighs 221 pounds.       Vitals:    09/25/22 0920   BP: 120/86   BP Source: Arm, Left Upper   Pulse: 68   SpO2: 97%   O2 Device: None (Room air)   PainSc: Zero   Weight: 100.4 kg (221 lb 6.4 oz)   Height: 165.1 cm (5' 5)     Body mass index is 36.84 kg/m?Marland Kitchen     Past Medical History  Patient Active Problem List    Diagnosis Date Noted    COVID-19 09/25/2020    PAC (premature atrial contraction) 09/22/2020    PVC (premature ventricular contraction) 09/22/2020    Abnormal Holter exam 03/17/2019    Other sleep apnea 08/27/2017    Anxiety 08/27/2017    GERD (gastroesophageal reflux disease) 07/23/2017    Tobacco abuse 07/10/2017    Palpitations 07/10/2017         Review of Systems   Constitutional: Negative.   HENT: Negative.     Eyes: Negative.    Cardiovascular:  Positive for leg swelling and palpitations.   Respiratory:  Positive for shortness of breath.    Endocrine: Negative.    Hematologic/Lymphatic: Negative.    Skin: Negative.    Musculoskeletal: Negative.    Gastrointestinal: Negative.    Genitourinary: Negative.    Neurological: Negative.    Psychiatric/Behavioral: Negative.     Allergic/Immunologic: Negative.        Physical Exam  General Appearance: obese  Skin: warm, moist, no ulcers or xanthomas  Eyes: conjunctivae and lids normal, pupils are equal and round  Lips & Oral Mucosa: no pallor or cyanosis  Neck Veins: neck veins are flat, neck veins are not distended  Chest Inspection: chest is normal in appearance  Respiratory Effort: breathing comfortably, no respiratory distress  Auscultation/Percussion: lungs clear to auscultation, no rales or rhonchi, no wheezing  Cardiac Rhythm: regular rhythm and normal rate  Cardiac Auscultation: S1, S2 normal, no rub, no gallop  Murmurs: no murmur  Carotid Arteries: normal carotid upstroke bilaterally, no bruit  Abdominal aorta: could not be examined due to obese adomen  Lower Extremity Edema: no lower extremity edema  Abdominal Exam: soft, non-tender, no masses, bowel sounds normal  Liver & Spleen: no organomegaly  Language and Memory: patient responsive and seems to comprehend information  Neurologic Exam: neurological assessment grossly intact      Cardiovascular Studies  Twelve-lead EKG demonstrates normal sinus rhythm, no ST segment T wave changes, no axis deviation, ventricular rate  62 bpm    Cardiovascular Health Factors  Vitals BP Readings from Last 3 Encounters:   09/25/22 120/86   09/17/21 122/74   09/25/20 106/80     Wt Readings from Last 3 Encounters:   09/25/22 100.4 kg (221 lb 6.4 oz)   09/17/21 92.5 kg (204 lb)   09/25/20 91.7 kg (202 lb 3.2 oz)     BMI Readings from Last 3 Encounters:   09/25/22 36.84 kg/m?   09/17/21 35.02 kg/m?   09/25/20 34.71 kg/m?      Smoking Social History     Tobacco Use   Smoking Status Former    Current packs/day: 0.00    Average packs/day: 0.5 packs/day for 15.0 years (7.5 ttl pk-yrs)    Types: Cigarettes    Quit date: 12/20/2020    Years since quitting: 1.7   Smokeless Tobacco Current   Tobacco Comments    Vape      Lipid Profile Cholesterol   Date Value Ref Range Status   09/16/2016 144  Final     HDL   Date Value Ref Range Status   09/16/2016 52  Final     LDL   Date Value Ref Range Status   09/16/2016 87  Final     Triglycerides   Date Value Ref Range Status   09/16/2016 41  Final      Blood Sugar No results found for: HGBA1C  Glucose   Date Value Ref Range Status   08/09/2021 111 (H) 70 - 105 Final   08/08/2021 127 (H) 70 - 105 Final   03/16/2018 104  Final          Problems Addressed Today  Encounter Diagnoses   Name Primary?    PVC (premature ventricular contraction) Yes    PAC (premature atrial contraction)     Tobacco abuse     Palpitations     Other sleep apnea     Gastroesophageal reflux disease, unspecified whether esophagitis present     Abnormal Holter exam     COVID-19     Cardiovascular symptoms     COVID-19 virus infection        Assessment and Plan       Assessment:    1.  Symptomatic heart palpitations  2.  Abnormal findings on a Holter monitor performed in June 2020  3.  Brief episodes of SVT  4.  Premature atrial contractions, SVE's-on the Holter monitor performed in June 2020 represented less than 1% of the total number of beats  5.  PVCs, monomorphic ventricular ectopy-this represented 1.1% of the total number of beats  6.  Bradycardia, second-degree Mobitz 1-this was noted to be present as well on the above Holter monitor recording, it did presumably occurred during sleep  7.  History of tobacco use  8.  History of COVID-19 viral infection  9.  Obesity, elevated BMI = 36.84 kg/m?  10.  Weight gain due to dietary indiscretion --see June 2020 patient gained 37 pounds.    Plan:    1.  Take scheduled Cardizem 30 mg p.o. twice daily  2.  Further evaluation with:  2D echo Doppler study  Long-term, 14 days Zio patch  Will follow-up on the results of this test and will call if further recommendations  4.  Follow-up office visit in 6 months    Total Time Today was 40 minutes in the following activities: Preparing to see the patient, Obtaining and/or reviewing separately obtained history, Performing a medically  appropriate examination and/or evaluation, Counseling and educating the patient/family/caregiver, Ordering medications, tests, or procedures, Referring and communication with other health care professionals (when not separately reported), Documenting clinical information in the electronic or other health record, Independently interpreting results (not separately reported) and communicating results to the patient/family/caregiver, and Care coordination (not separately reported)              Current Medications (including today's revisions)   buPROPion HCL SR (WELLBUTRIN SR) 150 mg tablet, 12 hr sustained-release TAKE 1 TABLET BY MOUTH DAILY X 3 DAYS THEN 1 TAB BY MOUTH TWICE DAILY.    cetirizine HCl (ZYRTEC PO) Take  by mouth daily.    dilTIAZem HCL (CARDIZEM) 30 mg tablet TAKE ONE TABLET BY MOUTH TWICE DAILY. INDICATIONS: PAROXYSMAL SUPRAVENTRICULAR TACHYCARDIA (Patient taking differently: Take one tablet by mouth daily. Indications: paroxysmal supraventricular tachycardia)    escitalopram oxalate (LEXAPRO) 10 mg tablet TAKE 1 TABLET BY MOUTH EVERY DAY FOR ANXIETY ASSOCIATED WITH DEPRESSION    loratadine (CLARITIN) 10 mg tablet Take one tablet by mouth as Needed.    MAGNESIUM PO Take 250 mg by mouth daily.    omeprazole(+) (PRILOSEC) 10 mg capsule Take one capsule by mouth as Needed.

## 2022-09-25 NOTE — Patient Instructions
Echocardiogram   Heart monitor    Follow up as directed.  Call sooner if issues.  Call the [place] nursing line at [phone].  Leave a detailed message for the nurse in [place] with how we can assist you and we will call you back.

## 2022-10-13 ENCOUNTER — Ambulatory Visit: Admit: 2022-10-13 | Discharge: 2022-10-13 | Payer: Commercial Managed Care - PPO

## 2022-10-13 ENCOUNTER — Encounter: Admit: 2022-10-13 | Discharge: 2022-10-13 | Payer: Commercial Managed Care - PPO

## 2022-10-13 DIAGNOSIS — K219 Gastro-esophageal reflux disease without esophagitis: Secondary | ICD-10-CM

## 2022-10-13 DIAGNOSIS — U071 COVID-19: Secondary | ICD-10-CM

## 2022-10-13 DIAGNOSIS — R002 Palpitations: Secondary | ICD-10-CM

## 2022-10-13 DIAGNOSIS — I493 Ventricular premature depolarization: Secondary | ICD-10-CM

## 2022-10-13 DIAGNOSIS — R9431 Abnormal electrocardiogram [ECG] [EKG]: Secondary | ICD-10-CM

## 2022-10-13 DIAGNOSIS — G4739 Other sleep apnea: Secondary | ICD-10-CM

## 2022-10-13 DIAGNOSIS — R0989 Other specified symptoms and signs involving the circulatory and respiratory systems: Secondary | ICD-10-CM

## 2022-10-13 DIAGNOSIS — I491 Atrial premature depolarization: Secondary | ICD-10-CM

## 2022-10-13 DIAGNOSIS — Z720 Tobacco use: Secondary | ICD-10-CM

## 2022-10-20 ENCOUNTER — Encounter: Admit: 2022-10-20 | Discharge: 2022-10-20 | Payer: Commercial Managed Care - PPO

## 2022-10-20 NOTE — Telephone Encounter
Sent MCM with normal results.

## 2022-10-20 NOTE — Telephone Encounter
-----   Message from Selinda Flavin, MD sent at 10/19/2022  8:27 PM CDT -----  Please let this patient know that the echocardiogram showed normal heart function no abnormalities of the heart valves.  Will call with the results of the Holter monitor when available.    Thank you  ----- Message -----  From: Altamease Oiler, MD  Sent: 10/13/2022  12:54 PM CDT  To: Dorris Fetch, MD

## 2022-10-25 ENCOUNTER — Encounter: Admit: 2022-10-25 | Discharge: 2022-10-25 | Payer: Commercial Managed Care - PPO

## 2022-10-25 MED ORDER — ESCITALOPRAM OXALATE 10 MG PO TAB
ORAL_TABLET | 3 refills
Start: 2022-10-25 — End: ?

## 2022-10-31 ENCOUNTER — Encounter: Admit: 2022-10-31 | Discharge: 2022-10-31 | Payer: Commercial Managed Care - PPO

## 2023-01-27 ENCOUNTER — Encounter: Admit: 2023-01-27 | Discharge: 2023-01-27 | Payer: Commercial Managed Care - PPO

## 2023-01-27 MED ORDER — ESCITALOPRAM OXALATE 10 MG PO TAB
ORAL_TABLET | 0 refills | Status: AC
Start: 2023-01-27 — End: ?

## 2023-04-20 ENCOUNTER — Encounter: Admit: 2023-04-20 | Discharge: 2023-04-20 | Payer: Commercial Managed Care - PPO

## 2023-04-26 ENCOUNTER — Encounter: Admit: 2023-04-26 | Discharge: 2023-04-26 | Payer: Commercial Managed Care - PPO

## 2023-05-19 ENCOUNTER — Encounter: Admit: 2023-05-19 | Discharge: 2023-05-19 | Payer: Commercial Managed Care - PPO

## 2023-05-19 MED ORDER — ESCITALOPRAM OXALATE 10 MG PO TAB
ORAL_TABLET | 0 refills | Status: AC
Start: 2023-05-19 — End: ?

## 2023-08-19 ENCOUNTER — Encounter: Admit: 2023-08-19 | Discharge: 2023-08-19 | Payer: Commercial Managed Care - PPO

## 2023-08-19 MED ORDER — ESCITALOPRAM OXALATE 10 MG PO TAB
ORAL_TABLET | 0 refills | Status: AC
Start: 2023-08-19 — End: ?

## 2023-10-20 ENCOUNTER — Encounter: Admit: 2023-10-20 | Discharge: 2023-10-20 | Payer: PRIVATE HEALTH INSURANCE

## 2023-10-20 MED ORDER — DILTIAZEM HCL 30 MG PO TAB
30 mg | ORAL_TABLET | Freq: Two times a day (BID) | ORAL | 1 refills | 90.00000 days | Status: AC
Start: 2023-10-20 — End: ?

## 2023-11-19 ENCOUNTER — Encounter: Admit: 2023-11-19 | Discharge: 2023-11-19 | Payer: PRIVATE HEALTH INSURANCE

## 2024-02-02 IMAGING — CT ABDOMEN_PELVIS W(Adult)
2 of 3 series · 12 of 25 positions shown, 14 images · IV contrast (agent unspecified)
Comparison: none

PROCEDURE: ABDOMEN_PELVIS W(Adult)
HISTORY: c/o abd pain , epigastric pain , nausea and vomiting .
TECHNIQUE: Axial CT imaging of the abdomen and pelvis was performed with IV contrast.
This exam was performed using one or more the following dose reduction techniques:
Automated exposure control, adjustment of the mA and/or KV according to the patient's
size or use of iterative reconstruction technique. Total DLP dose measures 549 mGy with a
total CTDI dose measuring 11 mGy.

[Series 2: abdomen_pelvis ax 3.00 br40 s3 · axial · 0.77mm/px · z∈[+1040,+1403]mm · 9 of 11 slices shown, 11 images]
[im 2/11  soft-tissue]
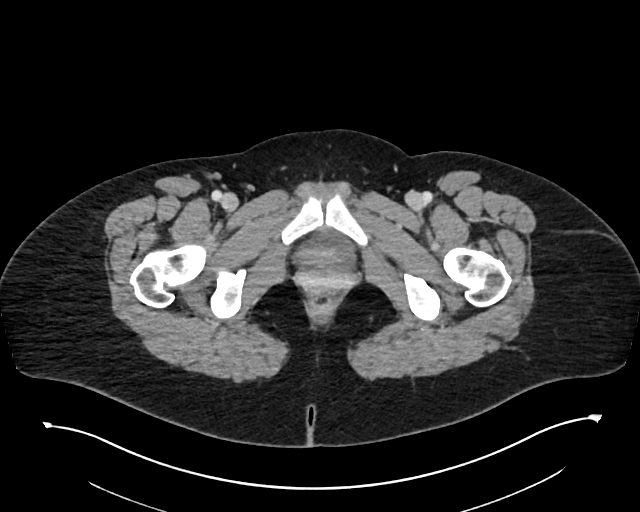
[im 2/11  bone]
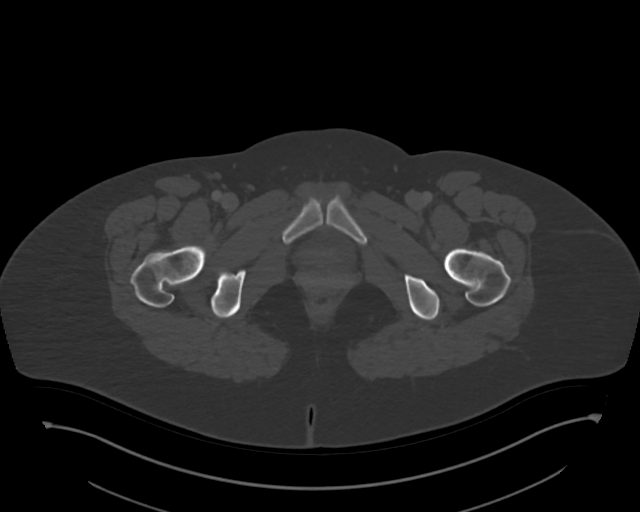
[im 3/11  soft-tissue]
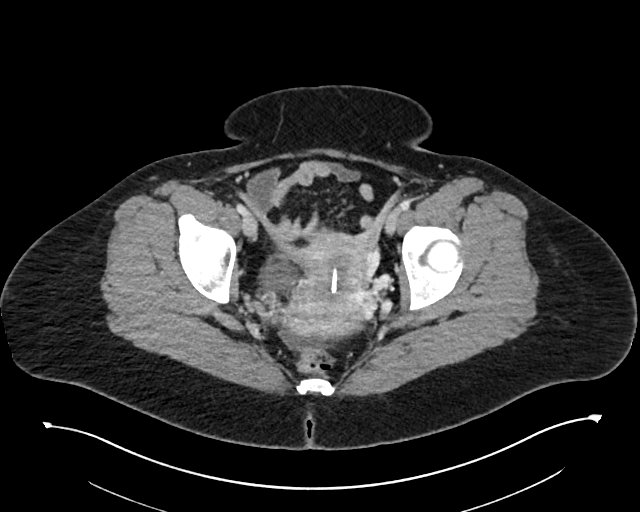
[im 4/11  soft-tissue]
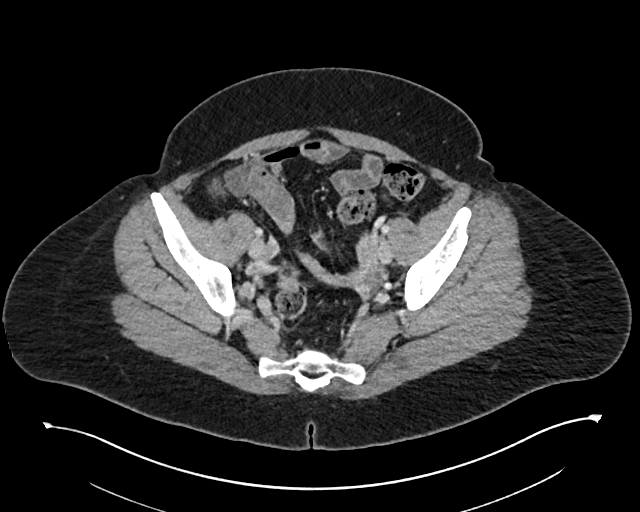
[im 5/11  soft-tissue]
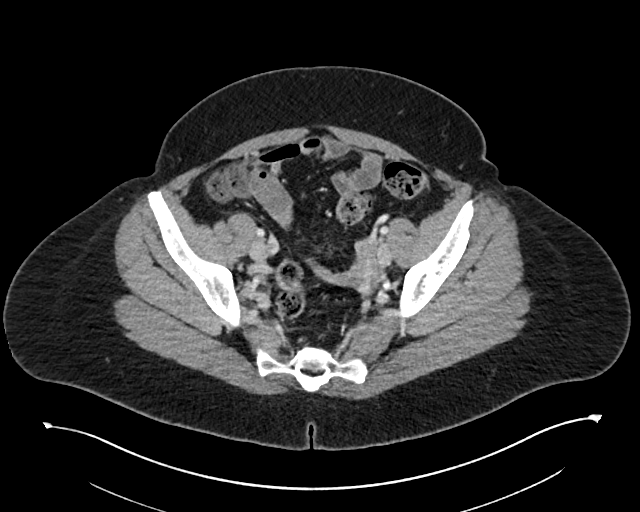
[im 6/11  soft-tissue]
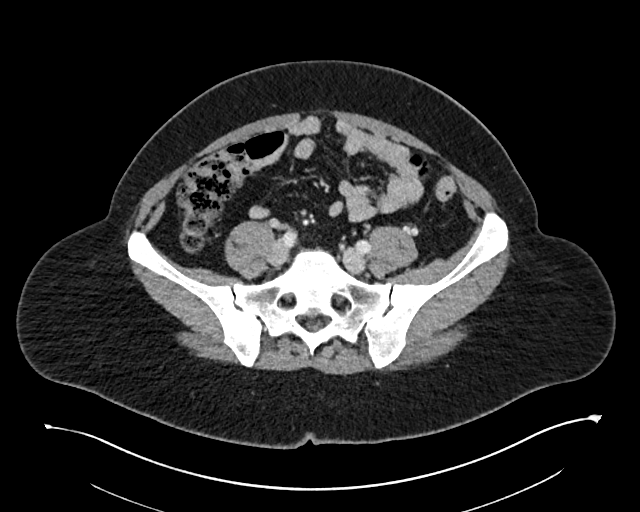
[im 7/11  soft-tissue]
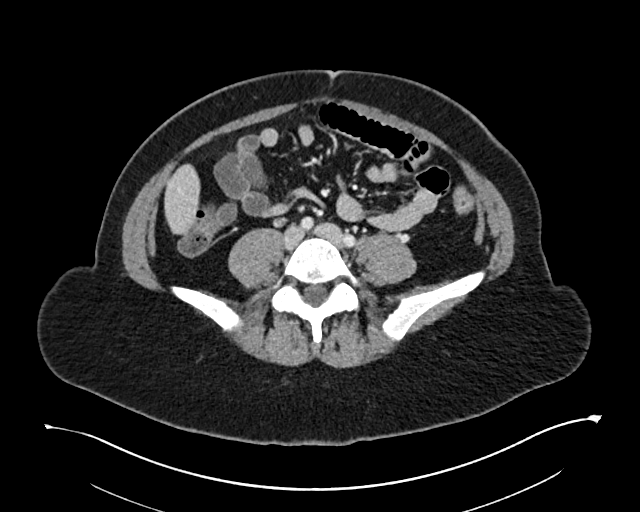
[im 8/11  soft-tissue]
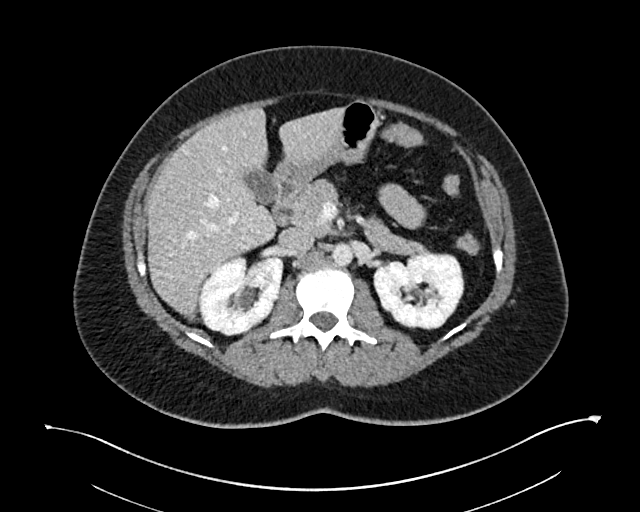
[im 9/11  soft-tissue]
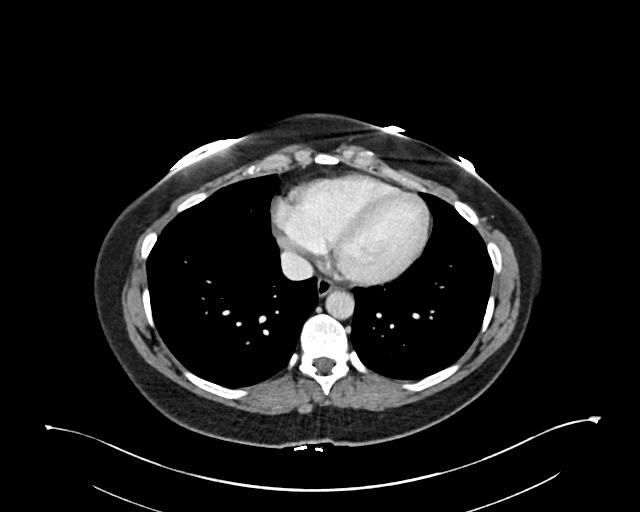
[im 10/11  soft-tissue]
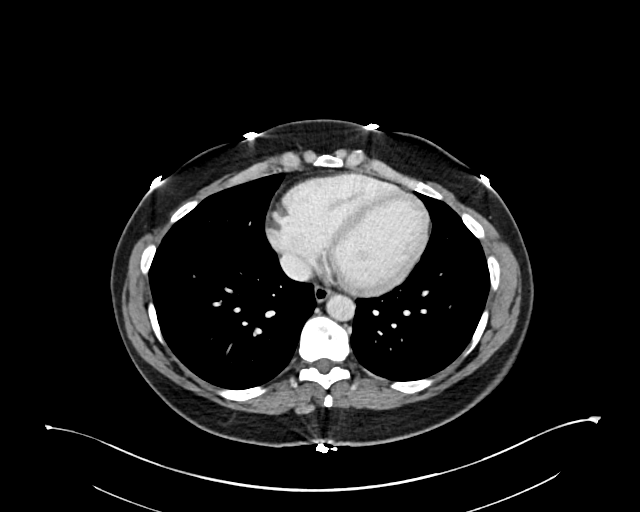
[im 10/11  bone]
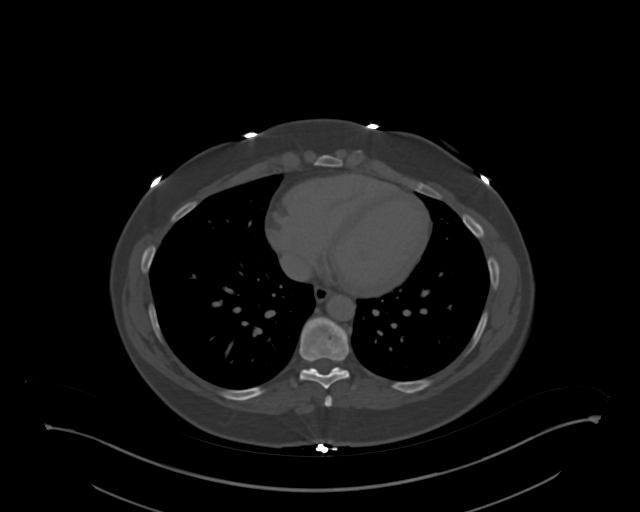

[Series 4: abdomen_pelvis cor 3.00 br40 s3 · coronal · 0.96mm/px · 3 of 12 slices shown]
[im 4/12  soft-tissue]
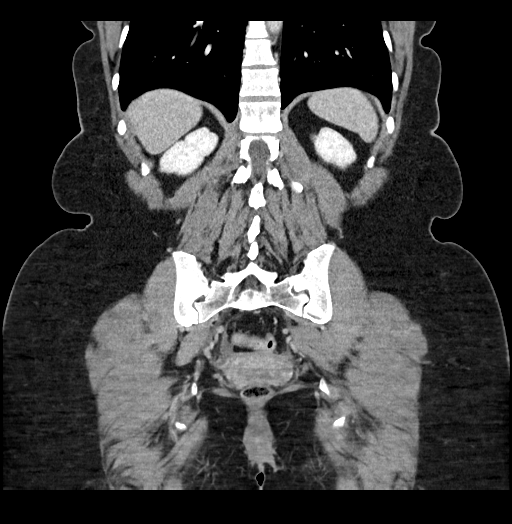
[im 5/12  soft-tissue]
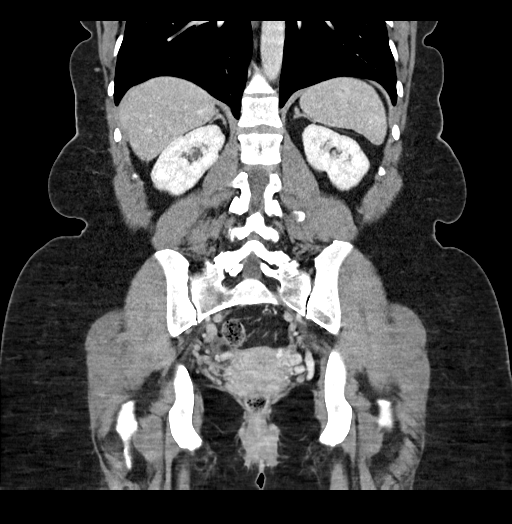
[im 7/12  soft-tissue]
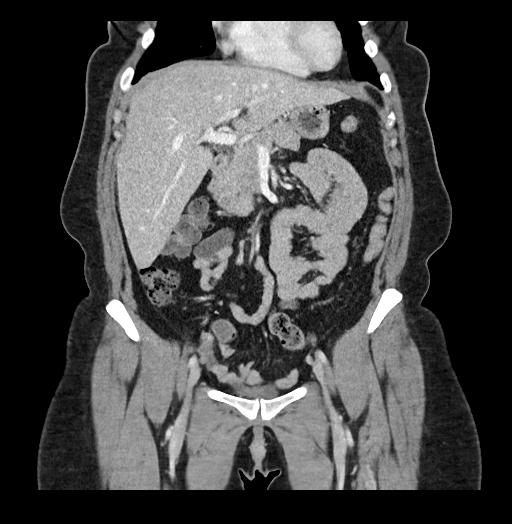

[12 of 25 positions shown; findings below may reference images not displayed]

FINDINGS: The lung bases are clear. The liver, spleen, gallbladder, pancreas, and adrenals are within
normal limits. There is no intrahepatic ductal dilatation seen. No mesenteric or
retroperitoneal adenopathy is seen. The right kidney shows no hydronephrosis or
perinephric fat stranding. The left kidney shows no hydronephrosis or perinephric fat
stranding. The aorta is normal in course and caliber. No pathologically dilated loops of
large or small bowel are seen. Normal appearing appendix is seen in the right lower
quadrant. Mildly prominent fluid-filled small bowel loop is seen in the left upper abdomen.
There is no free air or free fluid seen in the abdomen. The contrasted urinary bladder is
unremarkable. Uterus and ovaries are present with intrauterine device seen in the uterus.
IMPRESSION: 1. There is no hydronephrosis or perinephric fat stranding seen.
2. Nonobstructive bowel gas pattern without free air. Normal appearing appendix is seen in
the right lower quadrant.
3. Mildly prominent fluid-filled small bowel loop is seen in the left upper abdomen reflecting
focal ileus.

Tech Notes:

c/o abd pain , epigastric pain , nausea and vomiting .

## 2024-02-02 IMAGING — US ABDCM
1 series · 14 of 25 positions shown · non-contrast
Comparison: none

[Series 1: us abdomen complete · 14 of 99 slices shown]
[im 1/99]
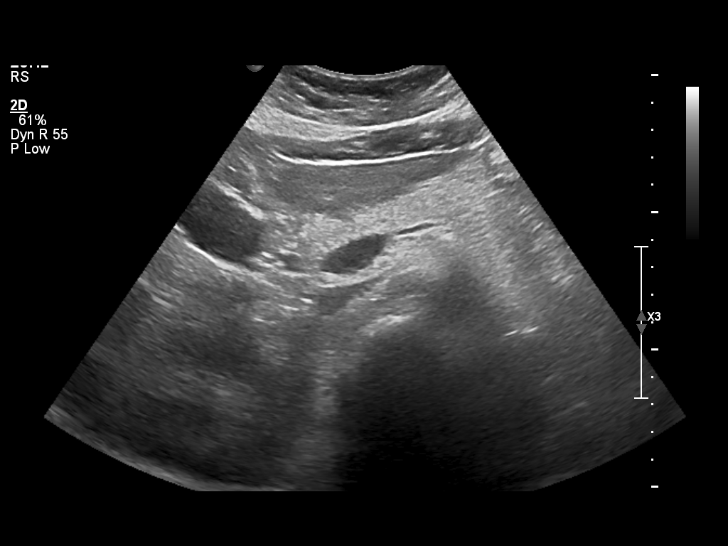
[im 9/99]
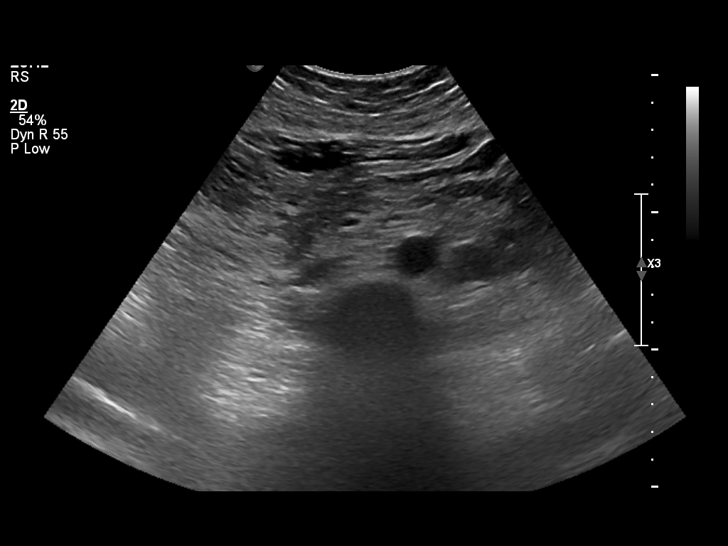
[im 17/99]
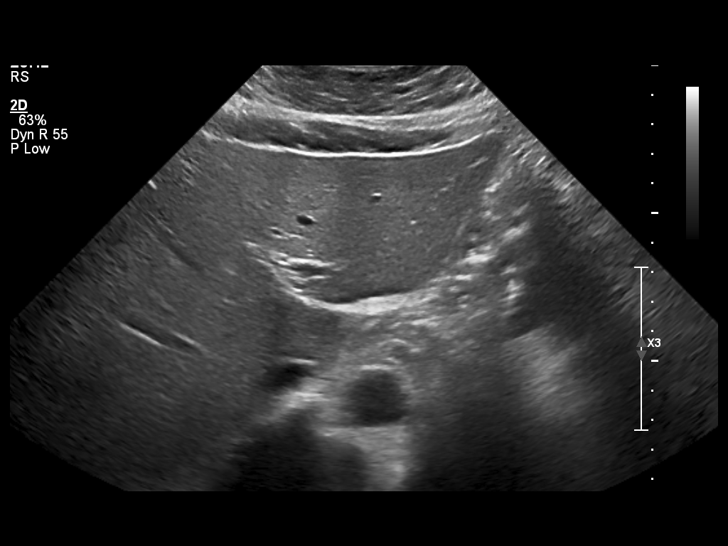
[im 25/99]
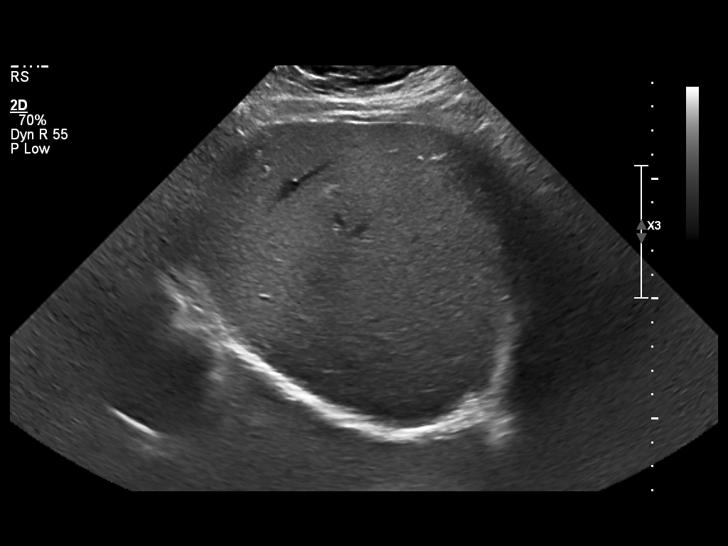
[im 33/99]
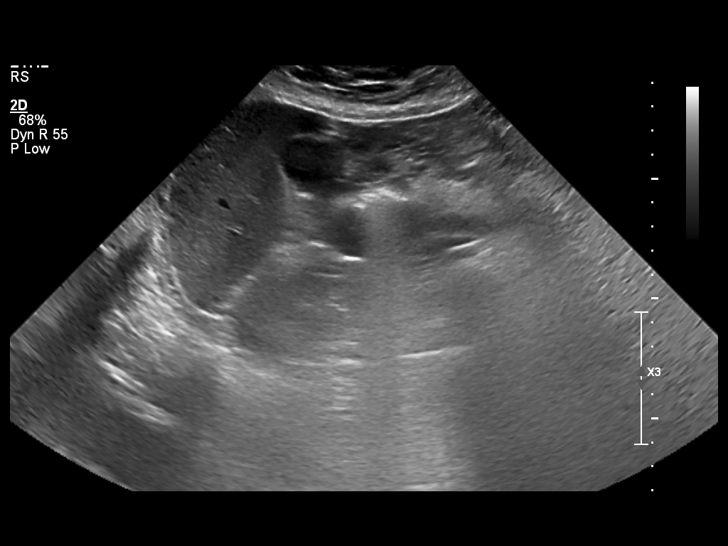
[im 37/99]
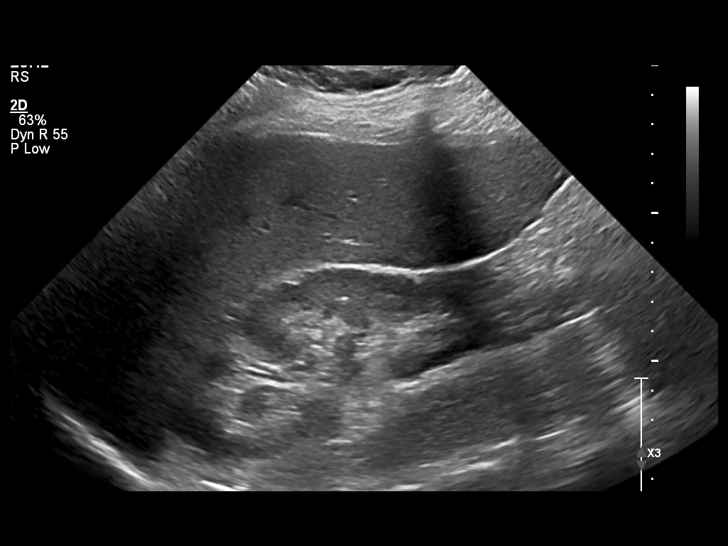
[im 45/99]
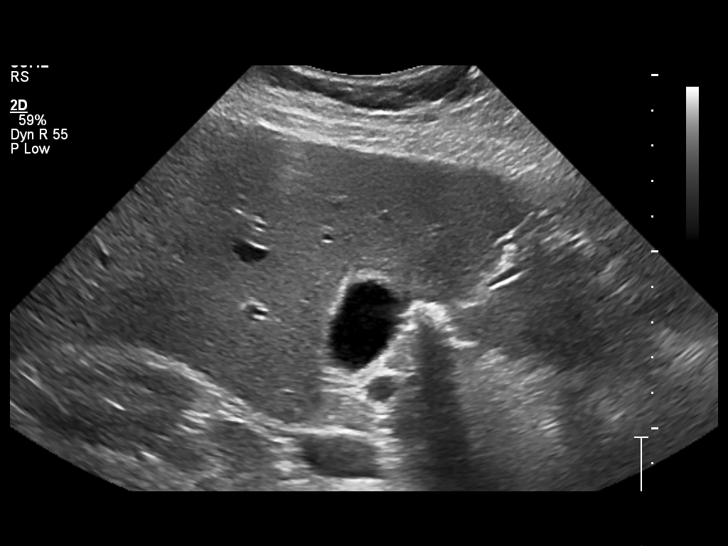
[im 54/99]
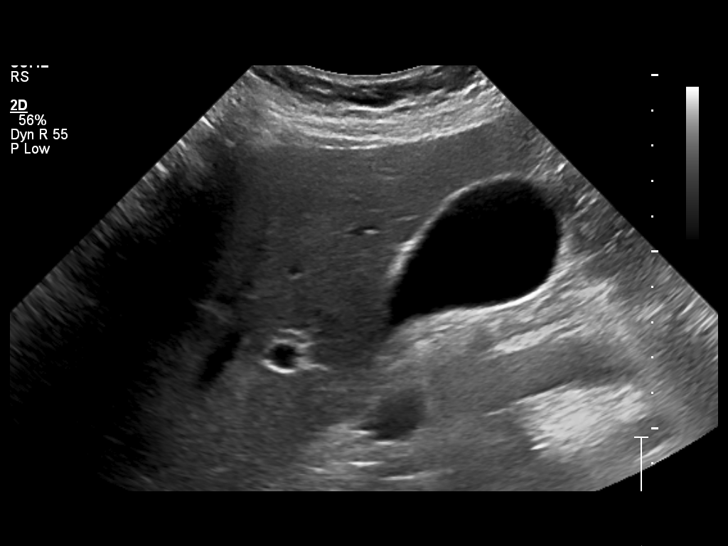
[im 62/99]
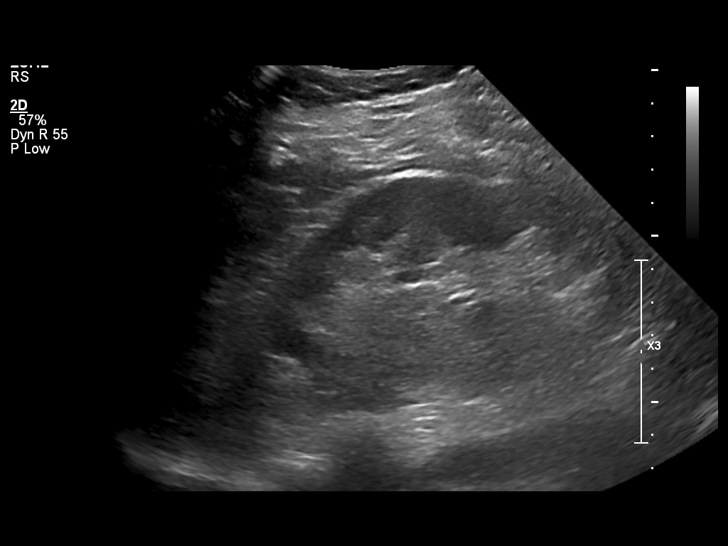
[im 66/99]
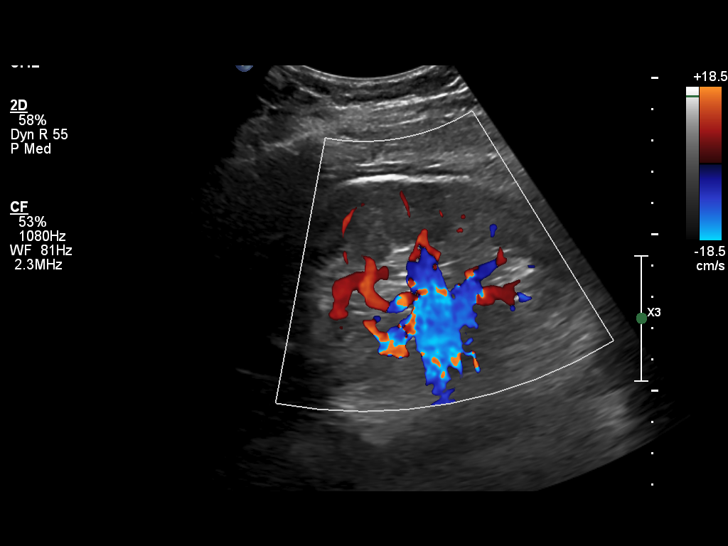
[im 74/99]
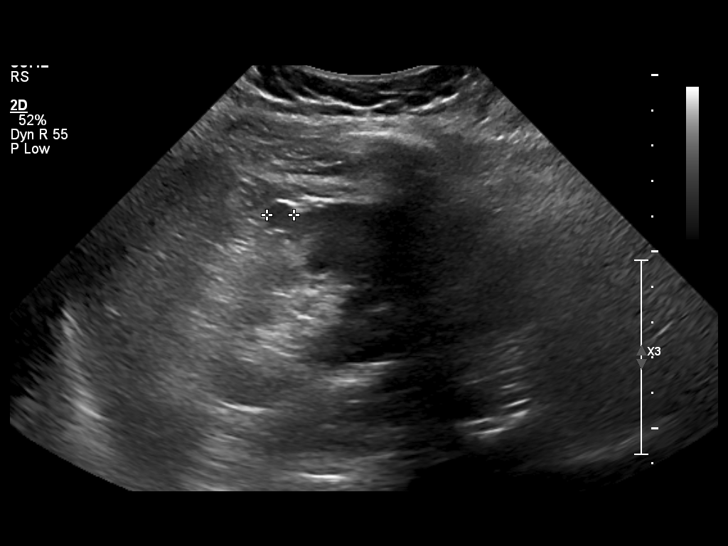
[im 82/99]
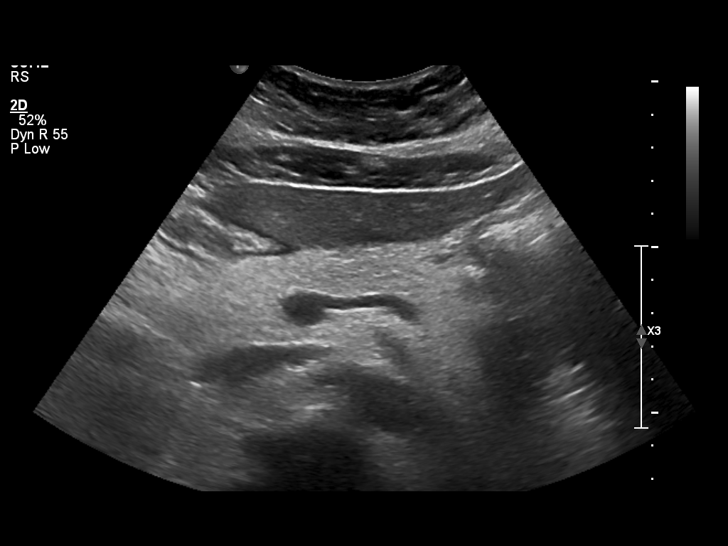
[im 90/99]
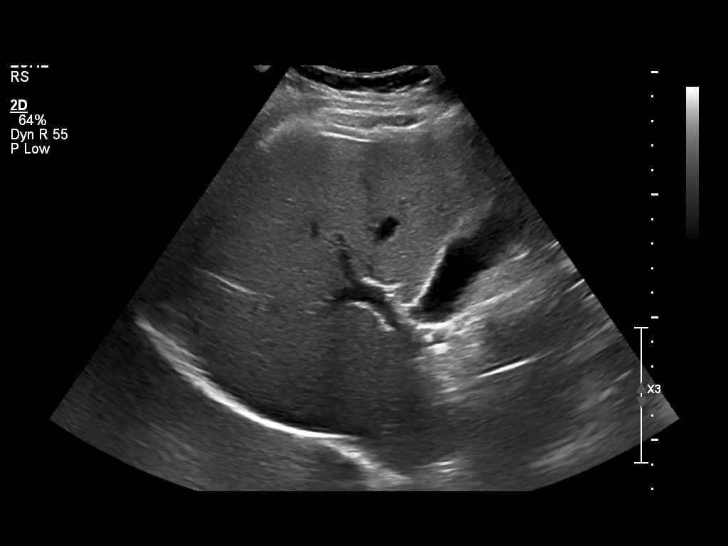
[im 99/99]
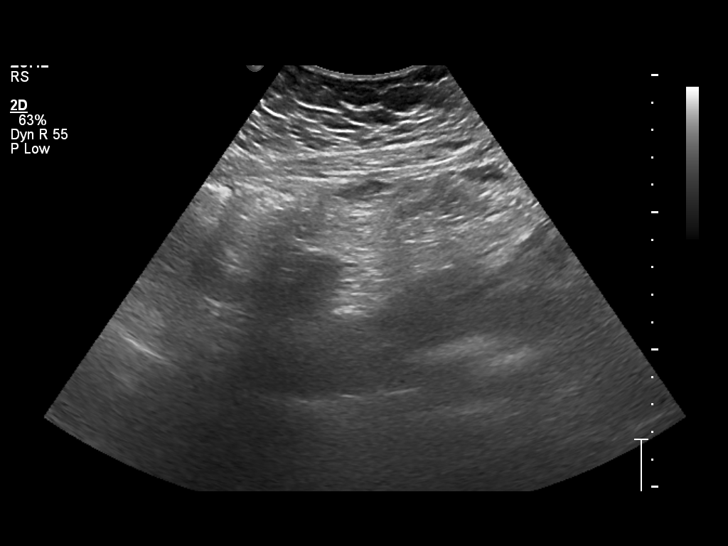

[14 of 25 positions shown; findings below may reference images not displayed]

DIAGNOSTIC STUDIES

EXAM

Abdominal ultrasound

INDICATION

Abdominal ultrasound
rlq pain with nausea

TECHNIQUE

Standard abdominal ultrasound was obtained.

COMPARISONS

None available

FINDINGS

The visualized aorta and pancreas are within normal limits. Liver is normal. Common bile duct
measures 2.1 millimeters. Gallbladder is normal. No stones are seen. Gallbladder wall measures
millimeters.

Spleen is normal at 8.5 cm.

Right kidney measures 11.7 cm. No hydronephrosis.

Left kidney measures 11.3 cm with a small 1 cm cyst.

IMPRESSION

Small left renal cyst otherwise unremarkable exam.

Tech Notes:

rlq pain with nausea
